# Patient Record
Sex: Female | Born: 1950 | Race: Black or African American | Hispanic: No | Marital: Single | Smoking: Never smoker
Health system: Southern US, Community
[De-identification: ages and names within clinical notes are randomized; demographics above are authoritative.]

## PROBLEM LIST (undated history)

## (undated) DIAGNOSIS — E119 Type 2 diabetes mellitus without complications: Secondary | ICD-10-CM

---

## 2018-02-16 ENCOUNTER — Encounter (HOSPITAL_BASED_OUTPATIENT_CLINIC_OR_DEPARTMENT_OTHER): Payer: Self-pay | Admitting: *Deleted

## 2018-02-16 ENCOUNTER — Emergency Department (HOSPITAL_BASED_OUTPATIENT_CLINIC_OR_DEPARTMENT_OTHER)
Admission: EM | Admit: 2018-02-16 | Discharge: 2018-02-16 | Disposition: A | Discharge status: Home / Self Care | Payer: Self-pay | Attending: Emergency Medicine | Admitting: Emergency Medicine

## 2018-02-16 ENCOUNTER — Other Ambulatory Visit: Payer: Self-pay

## 2018-02-16 DIAGNOSIS — Z7982 Long term (current) use of aspirin: Secondary | ICD-10-CM | POA: Insufficient documentation

## 2018-02-16 DIAGNOSIS — Y92410 Unspecified street and highway as the place of occurrence of the external cause: Secondary | ICD-10-CM | POA: Insufficient documentation

## 2018-02-16 DIAGNOSIS — M25561 Pain in right knee: Secondary | ICD-10-CM | POA: Insufficient documentation

## 2018-02-16 DIAGNOSIS — Y9389 Activity, other specified: Secondary | ICD-10-CM | POA: Insufficient documentation

## 2018-02-16 DIAGNOSIS — Y999 Unspecified external cause status: Secondary | ICD-10-CM | POA: Insufficient documentation

## 2018-02-16 MED ORDER — ACETAMINOPHEN 500 MG PO TABS
1000.0000 mg | ORAL_TABLET | Freq: Once | ORAL | Status: AC
  Administered 2018-02-16: 1000 mg via ORAL
  Filled 2018-02-16: qty 2
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
## 2018-03-29 ENCOUNTER — Encounter (HOSPITAL_BASED_OUTPATIENT_CLINIC_OR_DEPARTMENT_OTHER): Payer: Self-pay | Admitting: Emergency Medicine

## 2018-03-29 ENCOUNTER — Emergency Department (HOSPITAL_BASED_OUTPATIENT_CLINIC_OR_DEPARTMENT_OTHER)
Admission: EM | Admit: 2018-03-29 | Discharge: 2018-03-30 | Disposition: A | Discharge status: Home / Self Care | Payer: Self-pay | Attending: Emergency Medicine | Admitting: Emergency Medicine

## 2018-03-29 ENCOUNTER — Other Ambulatory Visit: Payer: Self-pay

## 2018-03-29 DIAGNOSIS — H538 Other visual disturbances: Secondary | ICD-10-CM | POA: Insufficient documentation

## 2018-03-29 DIAGNOSIS — R05 Cough: Secondary | ICD-10-CM | POA: Insufficient documentation

## 2018-03-29 DIAGNOSIS — R51 Headache: Secondary | ICD-10-CM | POA: Insufficient documentation

## 2018-03-29 DIAGNOSIS — H5712 Ocular pain, left eye: Secondary | ICD-10-CM | POA: Insufficient documentation

## 2018-03-29 DIAGNOSIS — J3489 Other specified disorders of nose and nasal sinuses: Secondary | ICD-10-CM | POA: Insufficient documentation

## 2018-03-29 DIAGNOSIS — R2689 Other abnormalities of gait and mobility: Secondary | ICD-10-CM | POA: Insufficient documentation

## 2018-03-29 MED ORDER — ERYTHROMYCIN 5 MG/GM OP OINT
TOPICAL_OINTMENT | Freq: Once | OPHTHALMIC | Status: AC
  Administered 2018-03-30: via OPHTHALMIC
  Filled 2018-03-29: qty 3.5

## 2018-03-29 MED ORDER — FLUORESCEIN SODIUM 1 MG OP STRP
1.0000 | ORAL_STRIP | Freq: Once | OPHTHALMIC | Status: AC
  Administered 2018-03-29: 1 via OPHTHALMIC
  Filled 2018-03-29: qty 1

## 2018-03-29 MED ORDER — TETRACAINE HCL 0.5 % OP SOLN
2.0000 [drp] | Freq: Once | OPHTHALMIC | Status: AC
  Administered 2018-03-29: 2 [drp] via OPHTHALMIC
  Filled 2018-03-29: qty 4

## 2018-03-29 NOTE — ED Triage Notes (Signed)
Patient states that she has had pain and burning to her left eye since Thursday - noted redness and swelling to her left eye

## 2018-03-29 NOTE — ED Provider Notes (Signed)
[facility] EMERGENCY DEPARTMENT Provider Note   CSN: [id number] Arrival date & time: 03/29/18  1901     History   Chief Complaint Chief Complaint  Patient presents with  . Eye Pain    HPI Rhonda Ballard is a 67 y.o. female.  HPI   Pt is a 67 y/o female with ah/o T2DM, HTN, who presents to the ED today c/o left eye pain that began about 2 days ago. Reports associated headache, photophobia, blurred vision, feeling off balance, rhinorrhea, cough, and painful extraocular movements. States it feels like there is something behind her eye. Denies FB sensation, numbness/weakness, slurred speech, aphasia, floaters, nausea, vomiting, rash, or trauma. Denies vision loss to the eye. 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  Has been taking tylenol and using eye drops with mild relief. Does not wear contacts. Wears glasses.   History reviewed. No pertinent past medical history.  There are no active problems to display for this patient.   History reviewed. No pertinent surgical history.   OB History   None    Home Medications    Prior to Admission medications   Not on File    Family History History reviewed. No pertinent family history.  Social History Social History   Tobacco Use  . Smoking status: Never Smoker  . Smokeless tobacco: Never Used  Substance Use Topics  . Alcohol use: Never    Frequency: Never  . Drug use: Never     Allergies   Patient has no known allergies.   Review of Systems Review of Systems  Constitutional: Negative for chills and fever.  HENT: Positive for rhinorrhea.   Eyes: Positive for photophobia, pain, discharge (tearing), redness and visual disturbance. Negative for itching.  Respiratory: Positive for cough. Negative for shortness of breath.   Cardiovascular: Negative for chest pain.  Gastrointestinal: Negative for nausea and vomiting.  Genitourinary: Negative for dysuria.  Musculoskeletal:  Negative for neck pain.  Skin: Negative for rash.  Neurological: Positive for headaches. Negative for weakness and numbness.    Physical Exam Updated Vital Signs BP (!) 152/84 (BP Location: Right Arm)   Pulse 94   Temp 98.2 F (36.8 C) (Oral)   Resp 18   Ht 5' (1.524 m)   Wt 65.8 kg   SpO2 99%   BMI 28.32 kg/m   Physical Exam  Constitutional: She appears well-developed and well-nourished. No distress.  HENT:  Head: Normocephalic and atraumatic.  Eyes: Pupils are equal, round, and reactive to light. EOM are normal.  Conjunctiva is injected on the left.  No significant pain with extraocular movements.  Fluorescein stain was completed and slit-lamp exam completed with uptake noted relatively diffusely without defined borders.  No dendritic appearance of uptake.  Tonometry R: 17, L: 18. Visual acuity; Bilat: 20/25, R: 20/20, L 20/50.  Positive photophobia.  Consensual photophobia.  Neck: Neck supple.  Cardiovascular: Normal rate and regular rhythm.  No murmur heard. Pulmonary/Chest: Effort normal and breath sounds normal. No respiratory distress.  Abdominal: Soft. There is no tenderness.  Musculoskeletal: She exhibits no edema.  Neurological: She is alert.  Mental Status:  Alert, thought content appropriate, able to give a coherent history. Speech fluent without evidence of aphasia. Able to follow 2 step commands without difficulty.  Cranial Nerves:  II:   pupils equal, round, reactive to light III,IV, VI: ptosis not present, extra-ocular motions intact bilaterally  V,VII: smile symmetric, facial light touch sensation equal  VIII: hearing grossly normal to voice  X: uvula elevates symmetrically  XI: bilateral shoulder shrug symmetric and strong XII: midline tongue extension without fassiculations Motor:  Normal tone. 5/5 strength of BUE and BLE major muscle groups including strong and equal grip strength and dorsiflexion/plantar flexion Sensory: light touch normal in all  extremities. DTRs: biceps and achilles 2+ symmetric b/l Cerebellar: normal finger-to-nose with bilateral upper extremities, normal heel to shin Gait: gait with mild ataxia to the right Positive romberg, negative pronator drift  Skin: Skin is warm and dry. Capillary refill takes less than 2 seconds.  Psychiatric: She has a normal mood and affect.  Nursing note and vitals reviewed.   ED Treatments / Results  Labs (all labs ordered are listed, but only abnormal results are displayed) Labs Reviewed - No data to display  EKG None  Radiology No results found.  Procedures Procedures (including critical care time)  Medications Ordered in ED Medications  fluorescein ophthalmic strip 1 strip (has no administration in time range)  tetracaine (PONTOCAINE) 0.5 % ophthalmic solution 2 drop (has no administration in time range)  erythromycin ophthalmic ointment (has no administration in time range)     Initial Impression / Assessment and Plan / ED Course  I have reviewed the triage vital signs and the nursing notes.  Pertinent labs & imaging results that were available during my care of the patient were reviewed by me and considered in my medical decision making (see chart for details).   11:55 PM CONSULT with Dr. Cathey EndowBowen with ophthalmology who recommended placing the patient on erythromycin ointment and having her follow-up with her ophthalmologist tomorrow.  He stated that if she is unable to follow-up with her ophthalmologist she can be seen in his office.  Discussed patient case with Dr. Criss AlvineGoldston who personally evaluated the patient and stated that he has low concern for CVA and does not feel that imaging of the brain is necessary at this time.  He recommended contacting ophthalmology for their recommendations.  Final Clinical Impressions(s) / ED Diagnoses   Final diagnoses:  Left eye pain   Patient is a 67 year old female presenting to the ED today complaining of left eye pain,  photophobia, and recent URI symptoms.  Has photophobia and consensual photophobia on exam.  Vision is mildly decreased in the left eye.  Pressures are normal bilaterally.  There is diffuse uptake of floor seen on slit-lamp exam.  No dendritic appearance of uptake.  Consulted with ophthalmology with recommendations as above.  Patient given erythromycin ointment in the ED and advised to call her neurologist in the morning to make an appointment for follow-up.  Advised her to return to the ER sooner if she has any new or worsening symptoms in the meantime.  Patient voiced understanding the plan reasons to return immediately to the ED.  All questions answered.  ED Discharge Orders    None       Rayne DuCouture, Kayron Kalmar S, PA-C 03/30/18 0005    Pricilla LovelessGoldston, Scott, MD 04/01/18 [phone]

## 2018-03-30 NOTE — ED Notes (Signed)
Pt discharged to home with family. NAD.  

## 2018-03-30 NOTE — Discharge Instructions (Addendum)
You were placed on an antibiotic ointment called erythromycin.  You should apply the ointment to your eye 4 times daily for the next 5 days.  Call your ophthalmology office tomorrow morning to make an appointment for follow-up.  If you are unable to make an appoint with your ophthalmologist tomorrow he may call the ophthalmologist that is listed on your discharge paperwork.  If you have any new or worsening symptoms in the meantime including any fevers, chills, vision changes, please return to the emergency department immediately.

## 2021-03-05 ENCOUNTER — Other Ambulatory Visit: Payer: Self-pay

## 2021-03-05 ENCOUNTER — Emergency Department (HOSPITAL_COMMUNITY)
Admission: EM | Admit: 2021-03-05 | Discharge: 2021-03-06 | Disposition: A | Discharge status: Home / Self Care | Payer: Medicare Other | Attending: Emergency Medicine | Admitting: Emergency Medicine

## 2021-03-05 ENCOUNTER — Encounter (HOSPITAL_COMMUNITY): Payer: Self-pay

## 2021-03-05 DIAGNOSIS — M545 Low back pain, unspecified: Secondary | ICD-10-CM | POA: Diagnosis not present

## 2021-03-05 DIAGNOSIS — R112 Nausea with vomiting, unspecified: Secondary | ICD-10-CM | POA: Diagnosis present

## 2021-03-05 DIAGNOSIS — Z7984 Long term (current) use of oral hypoglycemic drugs: Secondary | ICD-10-CM | POA: Diagnosis not present

## 2021-03-05 DIAGNOSIS — R000 Tachycardia, unspecified: Secondary | ICD-10-CM | POA: Diagnosis not present

## 2021-03-05 DIAGNOSIS — N3000 Acute cystitis without hematuria: Secondary | ICD-10-CM | POA: Diagnosis not present

## 2021-03-05 DIAGNOSIS — K5900 Constipation, unspecified: Secondary | ICD-10-CM | POA: Insufficient documentation

## 2021-03-05 DIAGNOSIS — I10 Essential (primary) hypertension: Secondary | ICD-10-CM | POA: Insufficient documentation

## 2021-03-05 DIAGNOSIS — Z794 Long term (current) use of insulin: Secondary | ICD-10-CM | POA: Diagnosis not present

## 2021-03-05 DIAGNOSIS — E1165 Type 2 diabetes mellitus with hyperglycemia: Secondary | ICD-10-CM | POA: Diagnosis not present

## 2021-03-05 DIAGNOSIS — R739 Hyperglycemia, unspecified: Secondary | ICD-10-CM

## 2021-03-05 MED ORDER — ONDANSETRON HCL 4 MG/2ML IJ SOLN
4.0000 mg | Freq: Once | INTRAMUSCULAR | Status: AC
  Administered 2021-03-06: 4 mg via INTRAVENOUS
  Filled 2021-03-05: qty 2

## 2021-03-05 MED ORDER — HYDROMORPHONE HCL 1 MG/ML IJ SOLN
0.5000 mg | Freq: Once | INTRAMUSCULAR | Status: AC
  Administered 2021-03-06: 0.5 mg via INTRAVENOUS
  Filled 2021-03-05: qty 1

## 2021-03-05 NOTE — ED Triage Notes (Signed)
Pt complains of left sided flank pain that wraps around to the front of her abdomen. This has been going on for 5 days. Pt states that she is now vomiting from pain.

## 2021-03-05 NOTE — ED Provider Notes (Signed)
[facility]-EMERGENCY DEPT Provider Note   CSN: [id number] Arrival date & time: 03/05/21  1957     History Chief Complaint  Patient presents with   Flank Pain   Abdominal Pain   Emesis    Rhonda Ballard is a 70 y.o. female with a history of diabetes mellitus who presents the emergency department with a chief complaint of vomiting.  The patient reports that she developed left sided lower back pain that has been intermittent for the last week.  She is unable to characterize the pain.  No known aggravating or alleviating factors.  Today, the pain began to radiate into her left lower abdomen accompanied by 7-8 episodes of nonbloody, nonbilious vomiting.  She attempted to take Tylenol for her symptoms, but was unable to keep any food or fluids.  She also adds that she has been constipated and has not had a bowel movement in 7 to 8 days.  She has a history of constipation, but states that she has never gone this long without having a bowel movement.  She has been passing flatus.  She denies fever, chills, diarrhea, dysuria, hematuria, chest pain, shortness of breath, vaginal bleeding, pain, or discharge, or URI symptoms.  She reports that she has been checking her blood sugars at home and they have been running between 300-350.  She has not been taking any medications for diabetes.  She finally called and got established with a new primary care provider and was seen on July 12.  She reports that she was not prescribed any medications from her visit.  She was initially having blurred vision, polydipsia, polyuria, but reports that over the last week that her urine has been more decreased.  Surgical history includes tubal ligation.  The history is provided by medical records and the patient. No language interpreter was used.      History reviewed. No pertinent past medical history.  There are no problems to display for this patient.   History reviewed. No pertinent  surgical history.   OB History   No obstetric history on file.     History reviewed. No pertinent family history.  Social History   Tobacco Use   Smoking status: Never   Smokeless tobacco: Never  Substance Use Topics   Alcohol use: Never   Drug use: Never    Home Medications Prior to Admission medications   Medication Sig Start Date End Date Taking? Authorizing Provider  Accu-Chek Softclix Lancets lancets Use as instructed 03/06/21  Yes Darrelyn Morro A, PA-C  cephALEXin (KEFLEX) 500 MG capsule Take 1 capsule (500 mg total) by mouth 4 (four) times daily for 5 days. 03/06/21 03/11/21 Yes Aleni Andrus A, PA-C  Dapagliflozin-metFORMIN HCl ER (XIGDUO XR) 12-998 MG TB24 Take 1 tablet by mouth daily. 03/06/21  Yes Liam Bossman A, PA-C  glucose blood (ACCU-CHEK GUIDE) test strip Use as instructed 03/06/21  Yes Danashia Landers A, PA-C  insulin glargine (LANTUS SOLOSTAR) 100 UNIT/ML Solostar Pen Inject 10 Units into the skin daily. 03/06/21  Yes Crewe Heathman A, PA-C  ondansetron (ZOFRAN ODT) 4 MG disintegrating tablet Take 1 tablet (4 mg total) by mouth every 8 (eight) hours as needed. 03/06/21  Yes Marijean Montanye A, PA-C  rosuvastatin (CRESTOR) 5 MG tablet Take 1 tablet (5 mg total) by mouth daily. 03/06/21  Yes Braedin Millhouse A, PA-C    Allergies    Patient has no known allergies.  Review of Systems   Review of Systems  Constitutional:  Negative  for activity change, chills and fever.  HENT:  Negative for congestion, sinus pressure, sinus pain and sore throat.   Eyes:  Negative for visual disturbance.  Respiratory:  Negative for cough, shortness of breath and wheezing.   Cardiovascular:  Negative for chest pain, palpitations and leg swelling.  Gastrointestinal:  Positive for abdominal pain, constipation, nausea and vomiting. Negative for blood in stool, diarrhea and rectal pain.  Genitourinary:  Negative for dysuria, flank pain, frequency, hematuria, pelvic pain, urgency, vaginal bleeding,  vaginal discharge and vaginal pain.  Musculoskeletal:  Positive for back pain. Negative for arthralgias, gait problem, joint swelling, myalgias, neck pain and neck stiffness.  Skin:  Negative for rash and wound.  Allergic/Immunologic: Negative for immunocompromised state.  Neurological:  Negative for seizures, syncope, weakness, light-headedness, numbness and headaches.  Psychiatric/Behavioral:  Negative for confusion.    Physical Exam Updated Vital Signs BP (!) 136/94   Pulse 93   Temp 98.6 F (37 C)   Resp 15   Ht 5' (1.524 m)   Wt 59.4 kg   SpO2 100%   BMI 25.58 kg/m   Physical Exam Vitals and nursing note reviewed.  Constitutional:      General: She is not in acute distress.    Appearance: She is not ill-appearing, toxic-appearing or diaphoretic.  HENT:     Head: Normocephalic.  Eyes:     Conjunctiva/sclera: Conjunctivae normal.  Cardiovascular:     Rate and Rhythm: Normal rate and regular rhythm.     Heart sounds: No murmur heard.   No friction rub. No gallop.  Pulmonary:     Effort: Pulmonary effort is normal. No respiratory distress.     Breath sounds: Normal breath sounds. No stridor. No wheezing, rhonchi or rales.  Chest:     Chest wall: No tenderness.  Abdominal:     General: There is no distension.     Palpations: Abdomen is soft. There is no mass.     Tenderness: There is abdominal tenderness. There is no right CVA tenderness, guarding or rebound.     Hernia: No hernia is present.     Comments: Tender to palpation in the left upper and lower abdomen as well as in the right lower quadrant.  There is no rebound and guarding.  Negative Rovsing sign.  No tenderness over McBurney's point.  Negative Murphy sign.  There is minimal left CVA tenderness.  No right CVA tenderness.  Hyperactive bowel sounds in all 4 quadrants.  Musculoskeletal:        General: No tenderness.     Cervical back: Neck supple.     Right lower leg: No edema.     Left lower leg: No edema.   Skin:    General: Skin is warm.     Findings: No rash.  Neurological:     Mental Status: She is alert.  Psychiatric:        Behavior: Behavior normal.    ED Results / Procedures / Treatments   Labs (all labs ordered are listed, but only abnormal results are displayed) Labs Reviewed  CBC WITH DIFFERENTIAL/PLATELET - Abnormal; Notable for the following components:      Result Value   Abs Immature Granulocytes 0.08 (*)    All other components within normal limits  COMPREHENSIVE METABOLIC PANEL - Abnormal; Notable for the following components:   Glucose, Bld 328 (*)    AST 14 (*)    All other components within normal limits  URINALYSIS, ROUTINE W REFLEX MICROSCOPIC -  Abnormal; Notable for the following components:   APPearance HAZY (*)    Glucose, UA >=500 (*)    Ketones, ur 80 (*)    Leukocytes,Ua LARGE (*)    WBC, UA >50 (*)    Bacteria, UA MANY (*)    All other components within normal limits  CBG MONITORING, ED - Abnormal; Notable for the following components:   Glucose-Capillary 325 (*)    All other components within normal limits  CBG MONITORING, ED - Abnormal; Notable for the following components:   Glucose-Capillary 243 (*)    All other components within normal limits  URINE CULTURE  LIPASE, BLOOD    EKG None  Radiology CT ABDOMEN PELVIS W CONTRAST  Result Date: 03/06/2021 CLINICAL DATA:  Left lower quadrant abdominal pain x5 days, nausea/vomiting EXAM: CT ABDOMEN AND PELVIS WITH CONTRAST TECHNIQUE: Multidetector CT imaging of the abdomen and pelvis was performed using the standard protocol following bolus administration of intravenous contrast. CONTRAST:  58mL OMNIPAQUE IOHEXOL 350 MG/ML SOLN COMPARISON:  None. FINDINGS: Lower chest: Mild linear scarring/atelectasis in the bilateral lower lungs, left lower lobe predominant, likely post infectious/inflammatory. Hepatobiliary: Liver is within normal limits. Gallbladder is unremarkable. No intrahepatic or extrahepatic  duct dilatation. Pancreas: Within normal limits. Spleen: Within normal limits. Adrenals/Urinary Tract: Adrenal glands are within normal limits. Kidneys are within normal limits.  No hydronephrosis. Bladder is mildly thick-walled. Stomach/Bowel: Stomach is within normal limits. No evidence of bowel obstruction. Normal appendix (series 2/image 60). No colonic wall thickening or inflammatory changes. Vascular/Lymphatic: No evidence of abdominal aortic aneurysm. No suspicious abdominopelvic lymphadenopathy. Reproductive: Uterus is within normal limits. Bilateral ovaries are within normal limits. Other: No abdominopelvic ascites. Musculoskeletal: Visualized osseous structures are within normal limits. IMPRESSION: Mildly thick-walled bladder, correlate for cystitis. Otherwise negative CT abdomen/pelvis. Electronically Signed   By: Charline Bills M.D.   On: 03/06/2021 02:45    Procedures Procedures   Medications Ordered in ED Medications  ondansetron (ZOFRAN) injection 4 mg (4 mg Intravenous Given 03/06/21 0019)  HYDROmorphone (DILAUDID) injection 0.5 mg (0.5 mg Intravenous Given 03/06/21 0019)  sodium chloride 0.9 % bolus 1,000 mL (0 mLs Intravenous Stopped 03/06/21 0314)  iohexol (OMNIPAQUE) 350 MG/ML injection 100 mL (80 mLs Intravenous Contrast Given 03/06/21 0210)  sodium chloride (PF) 0.9 % injection (  Given by Other 03/06/21 0154)  cefTRIAXone (ROCEPHIN) 1 g in sodium chloride 0.9 % 100 mL IVPB (0 g Intravenous Stopped 03/06/21 0349)    ED Course  I have reviewed the triage vital signs and the nursing notes.  Pertinent labs & imaging results that were available during my care of the patient were reviewed by me and considered in my medical decision making (see chart for details).    MDM Rules/Calculators/A&P                          70 year old female with a history of diabetes mellitus who presents to the emergency department with a chief complaint of a 1 week history of left-sided low back  pain that began radiating into the left lower abdomen today accompanied by nausea and vomiting.  She has also been more constipated over the last week.  No other associated symptoms.  She has a history of diabetes mellitus, but has not been on any medications for some time.  She has been symptomatic with polyuria, polydipsia, and blurred vision, but has noted urinary frequency has decreased over the last week as her symptoms have worsened.  Tachycardic.  Mild hypertension.  Afebrile.  Vital signs are otherwise unremarkable.  Labs and imaging been reviewed and independently interpreted by me.  No leukocytosis.  This is 328.  Normal bicarb and anion gap.  UA with significant ketonuria and glucosuria, likely secondary to untreated diabetes mellitus.  She does also have large leukocytes.  Nitrite negative.  I did review the patient's chart and it appears that her PCP has called and Lantus and metformin, but the patient has not yet started the medications.  It sounds as if there has been some confusion about what pharmacy the medications were called into.  She was given Dilaudid for pain control and Zofran for nausea, with good control of her symptoms.  IV fluid bolus given.  Differential diagnosis includes pyelonephritis, diverticulitis, bowel obstruction, mesenteric ischemia, obstructive uropathy, appendicitis.  Will order CT abdomen pelvis for further evaluation.  CT abdomen pelvis consistent with cystitis.  She was given Rocephin in the ED.  She was successfully fluid challenge.  Urine culture has been sent.  Will discharge home with symptomatic management for nausea and vomiting, Keflex for UTI.  I have reviewed her PCPs note and sent a prescription of her home medications to the pharmacy after confirming this with her.  She is hemodynamically stable and in no acute distress.  Safe for discharge to home with outpatient follow-up.  ER return precautions given.  Final Clinical Impression(s) / ED  Diagnoses Final diagnoses:  Acute cystitis without hematuria  Hyperglycemia    Rx / DC Orders ED Discharge Orders          Ordered    cephALEXin (KEFLEX) 500 MG capsule  4 times daily        03/06/21 0413    ondansetron (ZOFRAN ODT) 4 MG disintegrating tablet  Every 8 hours PRN        03/06/21 0413    rosuvastatin (CRESTOR) 5 MG tablet  Daily        03/06/21 0413    Accu-Chek Softclix Lancets lancets        03/06/21 0413    glucose blood (ACCU-CHEK GUIDE) test strip        03/06/21 0413    Dapagliflozin-metFORMIN HCl ER (XIGDUO XR) 12-998 MG TB24  Daily        03/06/21 0413    insulin glargine (LANTUS SOLOSTAR) 100 UNIT/ML Solostar Pen  Daily        03/06/21 0413             Barkley BoardsMcDonald, Tesslyn Baumert A, PA-C 03/06/21 0836    Sabas SousBero, Michael M, MD 03/07/21 [phone]

## 2021-03-06 ENCOUNTER — Encounter (HOSPITAL_COMMUNITY): Payer: Self-pay

## 2021-03-06 ENCOUNTER — Telehealth (HOSPITAL_COMMUNITY): Payer: Self-pay | Admitting: Emergency Medicine

## 2021-03-06 ENCOUNTER — Emergency Department (HOSPITAL_COMMUNITY): Payer: Medicare Other

## 2021-03-06 LAB — CBC WITH DIFFERENTIAL/PLATELET
Abs Immature Granulocytes: 0.08 10*3/uL — ABNORMAL HIGH (ref 0.00–0.07)
Basophils Absolute: 0 10*3/uL (ref 0.0–0.1)
Basophils Relative: 1 %
Eosinophils Absolute: 0 10*3/uL (ref 0.0–0.5)
Eosinophils Relative: 0 %
HCT: 42.1 % (ref 36.0–46.0)
Hemoglobin: 13.8 g/dL (ref 12.0–15.0)
Immature Granulocytes: 1 %
Lymphocytes Relative: 23 %
Lymphs Abs: 1.7 10*3/uL (ref 0.7–4.0)
MCH: 27.7 pg (ref 26.0–34.0)
MCHC: 32.8 g/dL (ref 30.0–36.0)
MCV: 84.4 fL (ref 80.0–100.0)
Monocytes Absolute: 0.4 10*3/uL (ref 0.1–1.0)
Monocytes Relative: 6 %
Neutro Abs: 5.1 10*3/uL (ref 1.7–7.7)
Neutrophils Relative %: 69 %
Platelets: 208 10*3/uL (ref 150–400)
RBC: 4.99 MIL/uL (ref 3.87–5.11)
RDW: 13 % (ref 11.5–15.5)
WBC: 7.4 10*3/uL (ref 4.0–10.5)
nRBC: 0 % (ref 0.0–0.2)

## 2021-03-06 LAB — URINALYSIS, ROUTINE W REFLEX MICROSCOPIC
Bilirubin Urine: NEGATIVE
Glucose, UA: >=500 mg/dL — AB
Hgb urine dipstick: NEGATIVE
Ketones, ur: 80 mg/dL — AB
Nitrite: NEGATIVE
Protein, ur: NEGATIVE mg/dL
Specific Gravity, Urine: 1.024 (ref 1.005–1.030)
WBC, UA: >50 WBC/hpf — ABNORMAL HIGH (ref 0–5)
pH: 6 (ref 5.0–8.0)

## 2021-03-06 LAB — CBG MONITORING, ED
Glucose-Capillary: 243 mg/dL — ABNORMAL HIGH (ref 70–99)
Glucose-Capillary: 325 mg/dL — ABNORMAL HIGH (ref 70–99)

## 2021-03-06 LAB — COMPREHENSIVE METABOLIC PANEL
ALT: 15 U/L (ref 0–44)
AST: 14 U/L — ABNORMAL LOW (ref 15–41)
Albumin: 4.4 g/dL (ref 3.5–5.0)
Alkaline Phosphatase: 82 U/L (ref 38–126)
Anion gap: 10 (ref 5–15)
BUN: 21 mg/dL (ref 8–23)
CO2: 29 mmol/L (ref 22–32)
Calcium: 9.9 mg/dL (ref 8.9–10.3)
Chloride: 99 mmol/L (ref 98–111)
Creatinine, Ser: 0.76 mg/dL (ref 0.44–1.00)
GFR, Estimated: >60 mL/min (ref >60)
Glucose, Bld: 328 mg/dL — ABNORMAL HIGH (ref 70–99)
Potassium: 4.3 mmol/L (ref 3.5–5.1)
Sodium: 138 mmol/L (ref 135–145)
Total Bilirubin: 0.8 mg/dL (ref 0.3–1.2)
Total Protein: 7.7 g/dL (ref 6.5–8.1)

## 2021-03-06 LAB — LIPASE, BLOOD: Lipase: 28 U/L (ref 11–51)

## 2021-03-06 MED ORDER — XIGDUO XR 5-1000 MG PO TB24: 1.0000 | ORAL_TABLET | Freq: Every day | ORAL | 0 refills | Status: AC

## 2021-03-06 MED ORDER — CEPHALEXIN 500 MG PO CAPS: 500.0000 mg | ORAL_CAPSULE | Freq: Four times a day (QID) | ORAL | 0 refills | Status: DC

## 2021-03-06 MED ORDER — SODIUM CHLORIDE 0.9 % IV SOLN
1.0000 g | Freq: Once | INTRAVENOUS | Status: AC
  Administered 2021-03-06: 1 g via INTRAVENOUS
  Filled 2021-03-06: qty 10

## 2021-03-06 MED ORDER — SODIUM CHLORIDE 0.9 % IV BOLUS
1000.0000 mL | Freq: Once | INTRAVENOUS | Status: AC
  Administered 2021-03-06: 1000 mL via INTRAVENOUS

## 2021-03-06 MED ORDER — ROSUVASTATIN CALCIUM 5 MG PO TABS: 5.0000 mg | ORAL_TABLET | Freq: Every day | ORAL | 0 refills | Status: AC

## 2021-03-06 MED ORDER — ONDANSETRON 4 MG PO TBDP: 4.0000 mg | ORAL_TABLET | Freq: Three times a day (TID) | ORAL | 0 refills | Status: DC | PRN

## 2021-03-06 MED ORDER — IOHEXOL 350 MG/ML SOLN
100.0000 mL | Freq: Once | INTRAVENOUS | Status: AC | PRN
  Administered 2021-03-06: 80 mL via INTRAVENOUS

## 2021-03-06 MED ORDER — ACCU-CHEK SOFTCLIX LANCETS MISC: 12 refills | Status: AC

## 2021-03-06 MED ORDER — ONDANSETRON 4 MG PO TBDP: 4.0000 mg | ORAL_TABLET | Freq: Three times a day (TID) | ORAL | 0 refills | Status: AC | PRN

## 2021-03-06 MED ORDER — ACCU-CHEK GUIDE VI STRP: ORAL_STRIP | 12 refills | Status: DC

## 2021-03-06 MED ORDER — ACCU-CHEK GUIDE VI STRP: ORAL_STRIP | 12 refills | Status: AC

## 2021-03-06 MED ORDER — SODIUM CHLORIDE (PF) 0.9 % IJ SOLN
INTRAMUSCULAR | Status: AC
  Filled 2021-03-06: qty 50

## 2021-03-06 MED ORDER — ROSUVASTATIN CALCIUM 5 MG PO TABS: 5.0000 mg | ORAL_TABLET | Freq: Every day | ORAL | 0 refills | Status: DC

## 2021-03-06 MED ORDER — LANTUS SOLOSTAR 100 UNIT/ML ~~LOC~~ SOPN: 10.0000 [IU] | PEN_INJECTOR | Freq: Every day | SUBCUTANEOUS | 11 refills | Status: DC

## 2021-03-06 MED ORDER — CEPHALEXIN 500 MG PO CAPS: 500.0000 mg | ORAL_CAPSULE | Freq: Four times a day (QID) | ORAL | 0 refills | Status: AC

## 2021-03-06 MED ORDER — LANTUS SOLOSTAR 100 UNIT/ML ~~LOC~~ SOPN: 10.0000 [IU] | PEN_INJECTOR | Freq: Every day | SUBCUTANEOUS | 11 refills | Status: AC

## 2021-03-06 MED ORDER — ACCU-CHEK SOFTCLIX LANCETS MISC: 12 refills | Status: DC

## 2021-03-06 MED ORDER — XIGDUO XR 5-1000 MG PO TB24: 1.0000 | ORAL_TABLET | Freq: Every day | ORAL | 0 refills | Status: DC

## 2021-03-06 NOTE — ED Notes (Signed)
Pt provided water for fluid challenge 

## 2021-03-06 NOTE — ED Notes (Signed)
Pt ambulatory to bathroom

## 2021-03-06 NOTE — Telephone Encounter (Signed)
8:50 AM Called by [facility] in regards to 7 different medications and diabetic supplies prescribed by provider yesterday.  Unfortunately, [facility] is unable to fill these medications as there is no supervising physician listed.  I was asked to represcribe with the supervising physician.  Reviewed patient's chart.  Medication sent.

## 2021-03-06 NOTE — ED Notes (Signed)
Pt tolerated PO challenge

## 2021-03-06 NOTE — Discharge Instructions (Signed)
Thank you for allowing me to care for you today in the Emergency Department.   Your symptoms today seem to be caused by urinary tract infection.  Your blood sugar was also high today.  Take 1 tablet of Keflex every 6 hours for the next 5 days for the urinary tract infection.  Make sure to complete this entire course of antibiotics even if your symptoms improve.  Let 1 tablet of Zofran dissolve under your tongue every 8 hours as needed for nausea or vomiting.  Take 650 mg of Tylenol or 600 mg of ibuprofen with food every 6 hours for pain.  You can alternate between these 2 medications every 3 hours if your pain returns.  For instance, you can take Tylenol at noon, followed by a dose of ibuprofen at 3, followed by second dose of Tylenol and 6.  Your pain should also resolve as your infection is improving.  I have called and a prescription of all of the medications for diabetes and high cholesterol that your primary care provider tried to call into your pharmacy.  Please call him to schedule a follow-up appointment for a recheck of your symptoms this week.  I have only called in a 1 month supply so you will need to get the remaining refills from your primary care provider.   There are 2 diabetes medications: Lantus and Dabagliflozin-metformin. Inject 10 units of Lantus into the skin at night. Take 1 tablet of dabagliflozin-metformin by mouth daily.  Make sure that you are checking your blood sugar at home regularly.  Take 1 tablet of rosuvastatin by mouth daily for high cholesterol.  You can follow-up with your primary care provider if you have any questions about these medications that he has prescribed.  Return to the emergency department if you become confused, have uncontrollable vomiting despite taking Zofran, if your pain significantly worsens after you have been on antibiotics for more than 48 to 72 hours, or if you have other new, concerning symptoms.

## 2021-03-07 LAB — URINE CULTURE: Culture: >=100000 — AB

## 2021-03-08 ENCOUNTER — Telehealth: Payer: Self-pay

## 2021-03-08 NOTE — Telephone Encounter (Signed)
Post ED Visit - Positive Culture Follow-up  Culture report reviewed by antimicrobial stewardship pharmacist: [facility] []  , Pharm.D. []  Enzo Bi, Pharm.D., BCPS AQ-ID []  , Pharm.D., BCPS []  Celedonio Miyamoto, .D., BCPS []  [place], .D., BCPS, AAHIVP []  Georgina Pillion, Pharm.D., BCPS, AAHIVP []  [address], PharmD, BCPS []  , PharmD, BCPS []  [place], PharmD, BCPS []  [address], PharmD []  , PharmD, BCPS []  Estella Husk, PharmD  Pharmacy Team [x]  Lysle Pearl, PharmD []  , PharmD []  Phillips Climes, PharmD []  , Rph []  Agapito Games) , PharmD []  Verlan Friends, PharmD []  , PharmD []  Mervyn Gay, PharmD []  , PharmD []  Vinnie Level, PharmD []  [facility], PharmD []  , PharmD []  [place], PharmD   Positive urine culture Treated with Cephalexin, organism sensitive to the same and no further patient follow-up is required at this time.  03/08/2021, 11:00 AM

## 2022-08-02 IMAGING — CT CT ABD-PELV W/ CM
2 of 5 series · 16 of 46 positions shown, 18 images · IV contrast (OMNIPAQUE 350)
Comparison: None.

CLINICAL DATA: Left lower quadrant abdominal pain x5 days,
nausea/vomiting

EXAM:
CT ABDOMEN AND PELVIS WITH CONTRAST
TECHNIQUE: Multidetector CT imaging of the abdomen and pelvis was performed
using the standard protocol following bolus administration of
intravenous contrast.
CONTRAST:  80mL OMNIPAQUE IOHEXOL 350 MG/ML SOLN

[Series 2: axial st · axial · 0.61mm/px · z∈[-570,-195]mm · 13 of 87 slices shown, 15 images]
[im 6/87  soft-tissue]
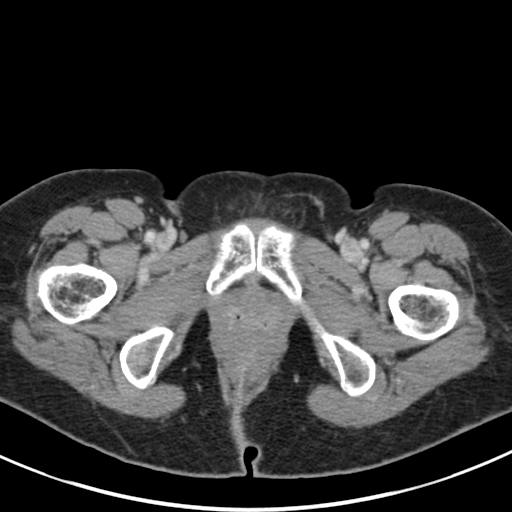
[im 6/87  bone]
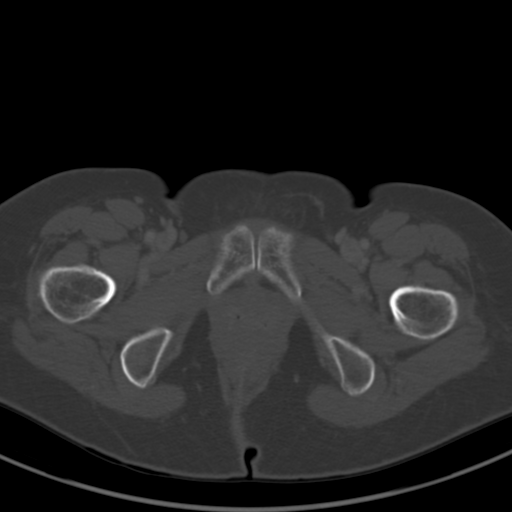
[im 12/87  soft-tissue]
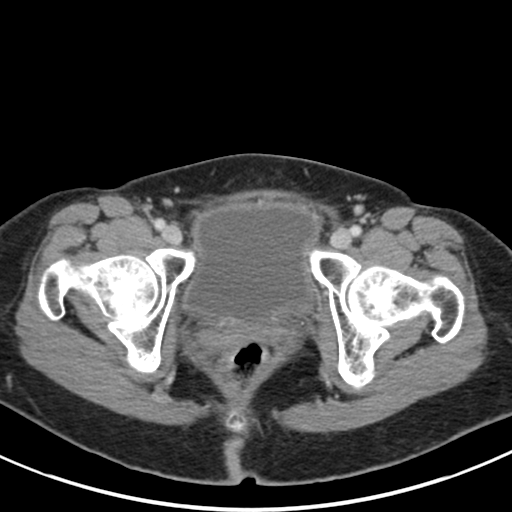
[im 18/87  soft-tissue]
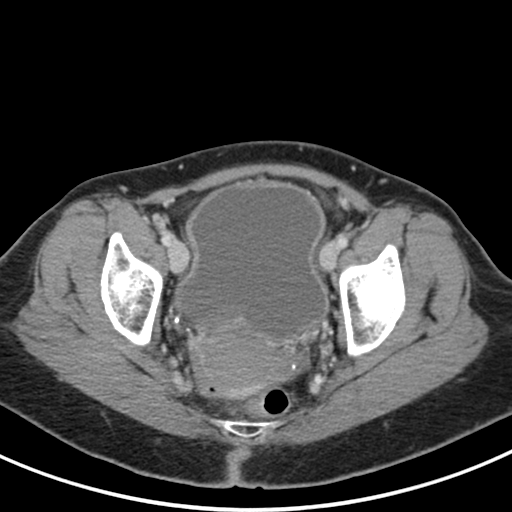
[im 23/87  soft-tissue]
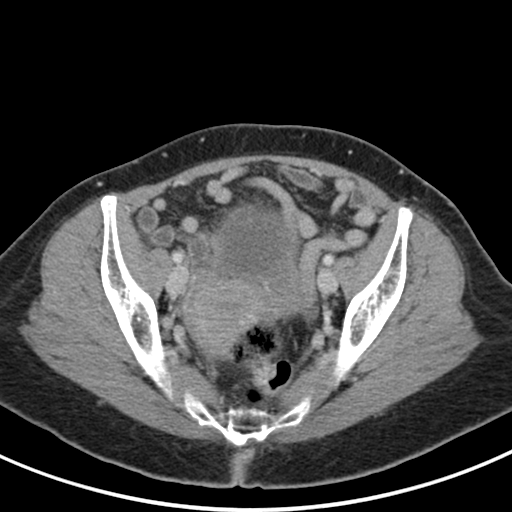
[im 29/87  soft-tissue]
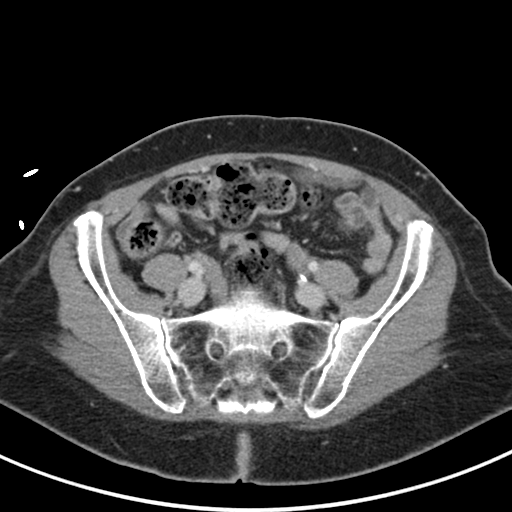
[im 35/87  soft-tissue]
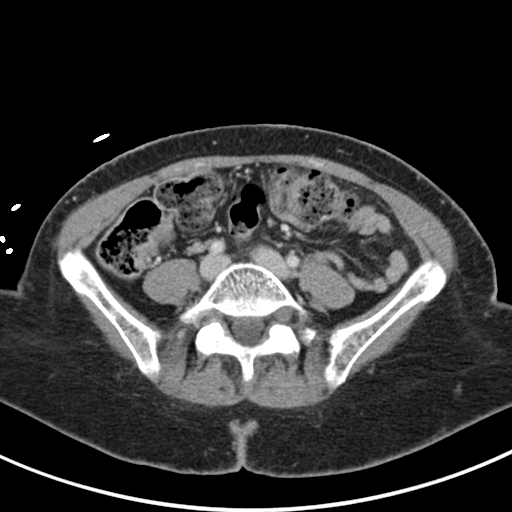
[im 46/87  soft-tissue]
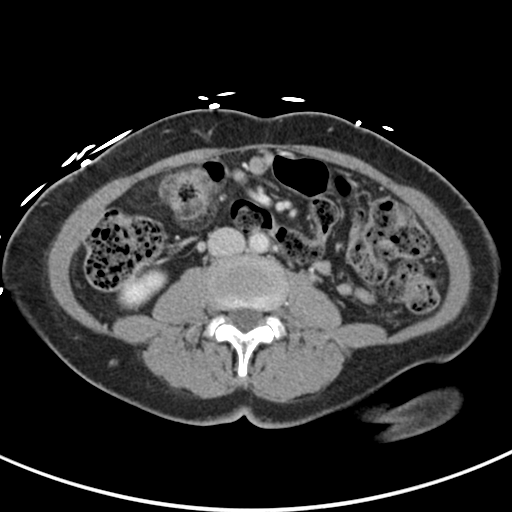
[im 52/87  soft-tissue]
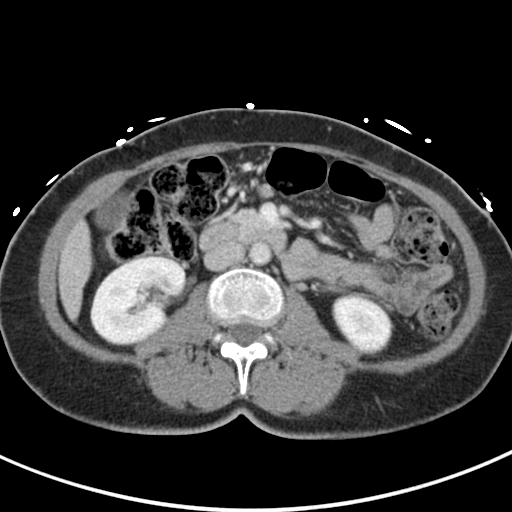
[im 58/87  soft-tissue]
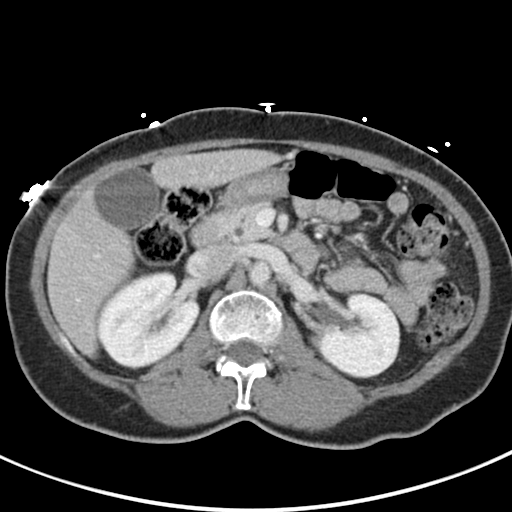
[im 58/87  bone]
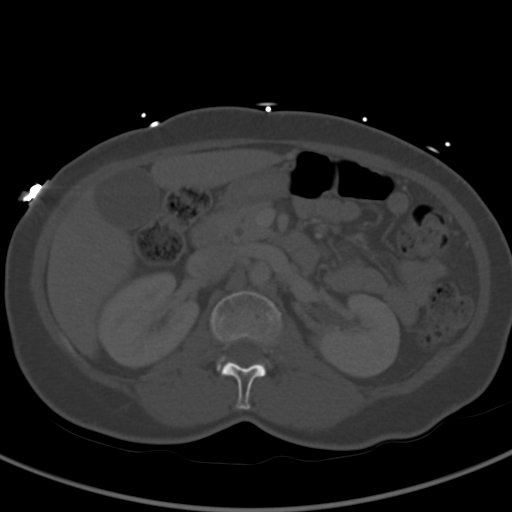
[im 64/87  soft-tissue]
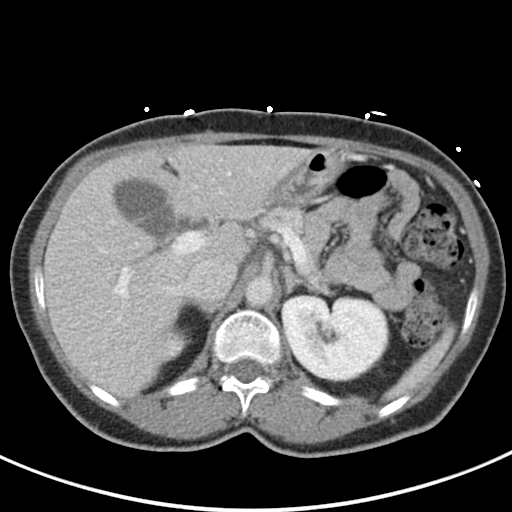
[im 69/87  soft-tissue]
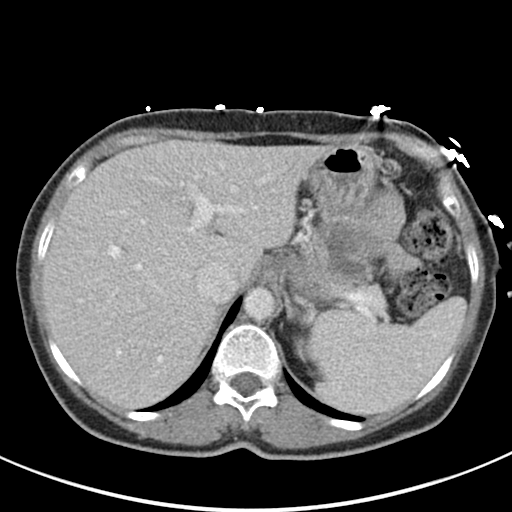
[im 75/87  soft-tissue]
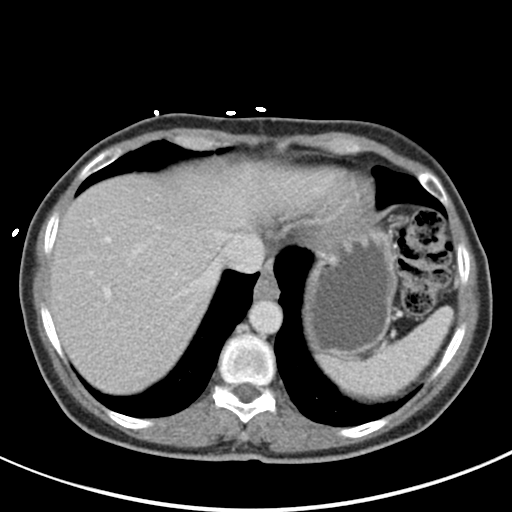
[im 81/87  soft-tissue]
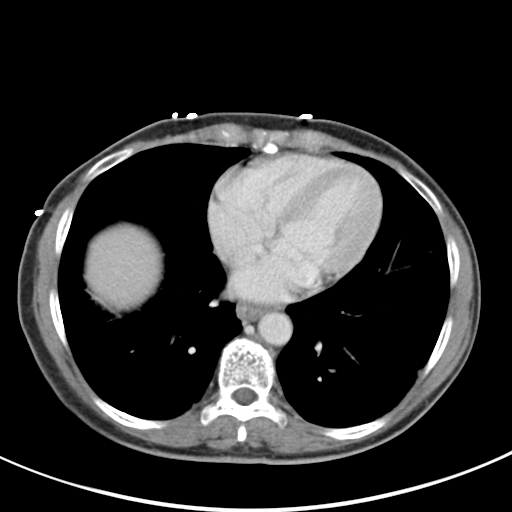

[Series 4: coronal st · coronal · 0.61mm/px · 3 of 112 slices shown]
[im 38/112  soft-tissue]
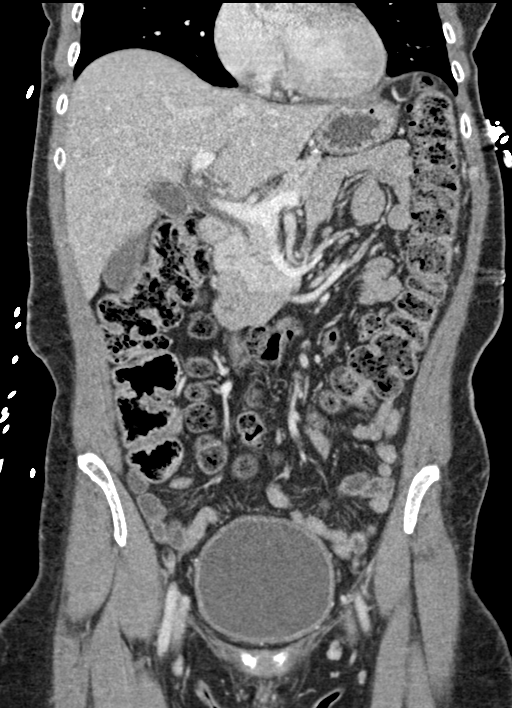
[im 50/112  soft-tissue]
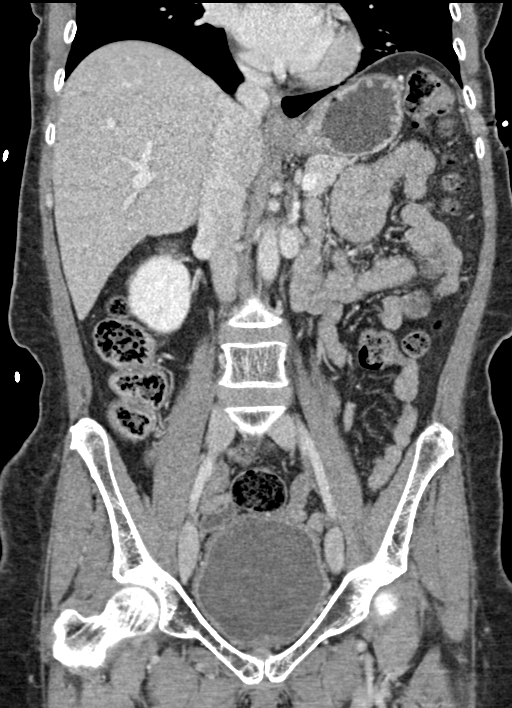
[im 62/112  soft-tissue]
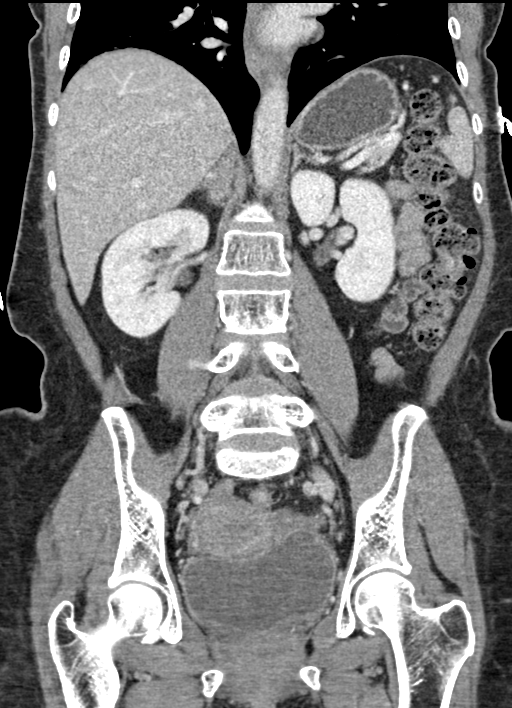

[16 of 46 positions shown; findings below may reference images not displayed]

FINDINGS: Lower chest: Mild linear scarring/atelectasis in the bilateral lower
lungs, left lower lobe predominant, likely post
infectious/inflammatory.

Hepatobiliary: Liver is within normal limits.

Gallbladder is unremarkable. No intrahepatic or extrahepatic duct
dilatation.

Pancreas: Within normal limits.

Spleen: Within normal limits.

Adrenals/Urinary Tract: Adrenal glands are within normal limits.

Kidneys are within normal limits.  No hydronephrosis.

Bladder is mildly thick-walled.

Stomach/Bowel: Stomach is within normal limits.

No evidence of bowel obstruction.

Normal appendix (series 2/image 60).

No colonic wall thickening or inflammatory changes.

Vascular/Lymphatic: No evidence of abdominal aortic aneurysm.

No suspicious abdominopelvic lymphadenopathy.

Reproductive: Uterus is within normal limits.

Bilateral ovaries are within normal limits.

Other: No abdominopelvic ascites.

Musculoskeletal: Visualized osseous structures are within normal
limits.
IMPRESSION: Mildly thick-walled bladder, correlate for cystitis.

Otherwise negative CT abdomen/pelvis.

## 2022-11-25 ENCOUNTER — Emergency Department (HOSPITAL_BASED_OUTPATIENT_CLINIC_OR_DEPARTMENT_OTHER): Payer: Medicare Other

## 2022-11-25 ENCOUNTER — Encounter (HOSPITAL_BASED_OUTPATIENT_CLINIC_OR_DEPARTMENT_OTHER): Payer: Self-pay | Admitting: Urology

## 2022-11-25 ENCOUNTER — Emergency Department (HOSPITAL_BASED_OUTPATIENT_CLINIC_OR_DEPARTMENT_OTHER)
Admission: EM | Admit: 2022-11-25 | Discharge: 2022-11-25 | Disposition: A | Discharge status: Home / Self Care | Payer: Medicare Other | Attending: Emergency Medicine | Admitting: Emergency Medicine

## 2022-11-25 DIAGNOSIS — R1013 Epigastric pain: Secondary | ICD-10-CM | POA: Diagnosis present

## 2022-11-25 DIAGNOSIS — E860 Dehydration: Secondary | ICD-10-CM | POA: Insufficient documentation

## 2022-11-25 DIAGNOSIS — K2970 Gastritis, unspecified, without bleeding: Secondary | ICD-10-CM | POA: Insufficient documentation

## 2022-11-25 DIAGNOSIS — T50905A Adverse effect of unspecified drugs, medicaments and biological substances, initial encounter: Secondary | ICD-10-CM | POA: Insufficient documentation

## 2022-11-25 DIAGNOSIS — Z794 Long term (current) use of insulin: Secondary | ICD-10-CM | POA: Insufficient documentation

## 2022-11-25 DIAGNOSIS — T887XXA Unspecified adverse effect of drug or medicament, initial encounter: Secondary | ICD-10-CM

## 2022-11-25 HISTORY — DX: Type 2 diabetes mellitus without complications: E11.9

## 2022-11-25 LAB — CBC WITH DIFFERENTIAL/PLATELET
Abs Immature Granulocytes: 0.02 10*3/uL (ref 0.00–0.07)
Basophils Absolute: 0 10*3/uL (ref 0.0–0.1)
Basophils Relative: 0 %
Eosinophils Absolute: 0 10*3/uL (ref 0.0–0.5)
Eosinophils Relative: 0 %
HCT: 46.3 % — ABNORMAL HIGH (ref 36.0–46.0)
Hemoglobin: 15.5 g/dL — ABNORMAL HIGH (ref 12.0–15.0)
Immature Granulocytes: 0 %
Lymphocytes Relative: 19 %
Lymphs Abs: 1.6 10*3/uL (ref 0.7–4.0)
MCH: 27.6 pg (ref 26.0–34.0)
MCHC: 33.5 g/dL (ref 30.0–36.0)
MCV: 82.5 fL (ref 80.0–100.0)
Monocytes Absolute: 0.5 10*3/uL (ref 0.1–1.0)
Monocytes Relative: 6 %
Neutro Abs: 6.2 10*3/uL (ref 1.7–7.7)
Neutrophils Relative %: 75 %
Platelets: 214 10*3/uL (ref 150–400)
RBC: 5.61 MIL/uL — ABNORMAL HIGH (ref 3.87–5.11)
RDW: 13 % (ref 11.5–15.5)
WBC: 8.4 10*3/uL (ref 4.0–10.5)
nRBC: 0 % (ref 0.0–0.2)

## 2022-11-25 LAB — COMPREHENSIVE METABOLIC PANEL
ALT: 17 U/L (ref 0–44)
AST: 16 U/L (ref 15–41)
Albumin: 4.2 g/dL (ref 3.5–5.0)
Alkaline Phosphatase: 66 U/L (ref 38–126)
Anion gap: 14 (ref 5–15)
BUN: 33 mg/dL — ABNORMAL HIGH (ref 8–23)
CO2: 24 mmol/L (ref 22–32)
Calcium: 9.3 mg/dL (ref 8.9–10.3)
Chloride: 95 mmol/L — ABNORMAL LOW (ref 98–111)
Creatinine, Ser: 1 mg/dL (ref 0.44–1.00)
GFR, Estimated: >60 mL/min (ref >60)
Glucose, Bld: 266 mg/dL — ABNORMAL HIGH (ref 70–99)
Potassium: 3.5 mmol/L (ref 3.5–5.1)
Sodium: 133 mmol/L — ABNORMAL LOW (ref 135–145)
Total Bilirubin: 1.2 mg/dL (ref 0.3–1.2)
Total Protein: 8.1 g/dL (ref 6.5–8.1)

## 2022-11-25 LAB — URINALYSIS, ROUTINE W REFLEX MICROSCOPIC
Bilirubin Urine: NEGATIVE
Glucose, UA: 250 mg/dL — AB
Ketones, ur: 40 mg/dL — AB
Nitrite: NEGATIVE
Protein, ur: 30 mg/dL — AB
Specific Gravity, Urine: 1.015 (ref 1.005–1.030)
pH: 6 (ref 5.0–8.0)

## 2022-11-25 LAB — URINALYSIS, MICROSCOPIC (REFLEX)

## 2022-11-25 LAB — LIPASE, BLOOD: Lipase: 32 U/L (ref 11–51)

## 2022-11-25 MED ORDER — ESOMEPRAZOLE MAGNESIUM 40 MG PO CPDR: 40.0000 mg | DELAYED_RELEASE_CAPSULE | Freq: Every day | ORAL | 0 refills | Status: AC

## 2022-11-25 MED ORDER — METOCLOPRAMIDE HCL 10 MG PO TABS: 10.0000 mg | ORAL_TABLET | Freq: Four times a day (QID) | ORAL | 0 refills | Status: AC | PRN

## 2022-11-25 MED ORDER — IOHEXOL 300 MG/ML  SOLN
100.0000 mL | Freq: Once | INTRAMUSCULAR | Status: AC | PRN
  Administered 2022-11-25: 100 mL via INTRAVENOUS

## 2022-11-25 MED ORDER — METOCLOPRAMIDE HCL 5 MG/ML IJ SOLN
10.0000 mg | Freq: Once | INTRAMUSCULAR | Status: AC
  Administered 2022-11-25: 10 mg via INTRAVENOUS
  Filled 2022-11-25: qty 2

## 2022-11-25 MED ORDER — DIPHENHYDRAMINE HCL 50 MG/ML IJ SOLN
25.0000 mg | Freq: Once | INTRAMUSCULAR | Status: AC
  Administered 2022-11-25: 25 mg via INTRAVENOUS
  Filled 2022-11-25: qty 1

## 2022-11-25 MED ORDER — SODIUM CHLORIDE 0.9 % IV BOLUS
1000.0000 mL | Freq: Once | INTRAVENOUS | Status: AC
  Administered 2022-11-25: 1000 mL via INTRAVENOUS

## 2022-11-25 NOTE — ED Provider Notes (Signed)
[facility] Provider Note   CSN: [id number] Arrival date & time: 11/25/22  2009     History  Chief Complaint  Patient presents with   Emesis    Keerthi Westfall is a 72 y.o. female here presenting with vomiting.  Patient states that she was started on Ozempic about a month ago for weight loss.  She states that about a week ago she started vomiting.  She has epigastric pain as well and was initially unable to tolerate anything.  She called her doctor and her doctor told her to stop Ozempic and she had a follow-up with her doctor today.  She had unremarkable labs in the clinic and was prescribed Zofran.  She took Zofran but still was vomiting and unable to keep down her diabetic meds.  Denies any sick contacts.  The history is provided by the patient.       Home Medications Prior to Admission medications   Medication Sig Start Date End Date Taking? Authorizing Provider  Accu-Chek Softclix Lancets lancets Use as instructed 03/06/21   Renne Crigler, PA-C  Dapagliflozin-metFORMIN HCl ER (XIGDUO XR) 12-998 MG TB24 Take 1 tablet by mouth daily. 03/06/21   Renne Crigler, PA-C  glucose blood (ACCU-CHEK GUIDE) test strip Use as instructed 03/06/21   Renne Crigler, PA-C  insulin glargine (LANTUS SOLOSTAR) 100 UNIT/ML Solostar Pen Inject 10 Units into the skin daily. 03/06/21   Renne Crigler, PA-C  ondansetron (ZOFRAN ODT) 4 MG disintegrating tablet Take 1 tablet (4 mg total) by mouth every 8 (eight) hours as needed. 03/06/21   Renne Crigler, PA-C  rosuvastatin (CRESTOR) 5 MG tablet Take 1 tablet (5 mg total) by mouth daily. 03/06/21   Renne Crigler, PA-C      Allergies    Patient has no known allergies.    Review of Systems   Review of Systems  Gastrointestinal:  Positive for vomiting.  All other systems reviewed and are negative.   Physical Exam Updated Vital Signs BP (!) 143/77 (BP Location: Left Arm)   Pulse (!) 117   Temp 97.8 F (36.6  C)   Resp 18   Ht 5' (1.524 m)   Wt 59.9 kg   SpO2 100%   BMI 25.78 kg/m  Physical Exam Vitals and nursing note reviewed.  Constitutional:      Comments: Slightly dehydrated  HENT:     Head: Normocephalic.     Nose: Nose normal.     Mouth/Throat:     Mouth: Mucous membranes are dry.  Eyes:     Extraocular Movements: Extraocular movements intact.     Pupils: Pupils are equal, round, and reactive to light.  Cardiovascular:     Rate and Rhythm: Normal rate and regular rhythm.     Pulses: Normal pulses.     Heart sounds: Normal heart sounds.  Pulmonary:     Effort: Pulmonary effort is normal.     Breath sounds: Normal breath sounds.  Abdominal:     General: Abdomen is flat.     Palpations: Abdomen is soft.     Comments: Mild LUQ tenderness   Musculoskeletal:        General: Normal range of motion.     Cervical back: Normal range of motion and neck supple.  Skin:    General: Skin is warm.     Capillary Refill: Capillary refill takes less than 2 seconds.  Neurological:     General: No focal deficit present.  Mental Status: She is alert and oriented to person, place, and time.  Psychiatric:        Mood and Affect: Mood normal.        Behavior: Behavior normal.     ED Results / Procedures / Treatments   Labs (all labs ordered are listed, but only abnormal results are displayed) Labs Reviewed  CBC WITH DIFFERENTIAL/PLATELET - Abnormal; Notable for the following components:      Result Value   RBC 5.61 (*)    Hemoglobin 15.5 (*)    HCT 46.3 (*)    All other components within normal limits  COMPREHENSIVE METABOLIC PANEL - Abnormal; Notable for the following components:   Sodium 133 (*)    Chloride 95 (*)    Glucose, Bld 266 (*)    BUN 33 (*)    All other components within normal limits  LIPASE, BLOOD  URINALYSIS, ROUTINE W REFLEX MICROSCOPIC    EKG None  Radiology No results found.  Procedures Procedures    Medications Ordered in ED Medications   sodium chloride 0.9 % bolus 1,000 mL (1,000 mLs Intravenous New Bag/Given 11/25/22 2034)  metoCLOPramide (REGLAN) injection 10 mg (10 mg Intravenous Given 11/25/22 2035)  diphenhydrAMINE (BENADRYL) injection 25 mg (25 mg Intravenous Given 11/25/22 2034)    ED Course/ Medical Decision Making/ A&P                             Medical Decision Making Nikitta Wiedner is a 72 y.o. female here with LUQ pain and vomiting.  Consider gastroenteritis or colitis or diverticulitis or side effect from Ozempic.  Plan to get CBC and CMP and lipase and CT abdomen pelvis.  Will hydrate and reassess.  11:03 PM Reviewed patient's labs independently interpreted CT scan.  Patient's UA showed some ketones but no obvious UTI.  Labs unremarkable.  CT abdomen pelvis showed possible esophagitis versus gastritis.  I think this may be side effect of Ozempic versus gastroenteritis.  Plan to stop Ozempic.  Will prescribe PPI as well as nausea medicine.  She had colonoscopy with Dr. Octaviano Glow from [facility] and I told her to follow-up with GI.  Problems Addressed: Gastritis without bleeding, unspecified chronicity, unspecified gastritis type: acute illness or injury Medication side effect: acute illness or injury  Amount and/or Complexity of Data Reviewed Labs: ordered. Decision-making details documented in ED Course. Radiology: ordered and independent interpretation performed. Decision-making details documented in ED Course. ECG/medicine tests: ordered and independent interpretation performed. Decision-making details documented in ED Course.  Risk Prescription drug management.    Final Clinical Impression(s) / ED Diagnoses Final diagnoses:  None    Rx / DC Orders ED Discharge Orders     None         Charlynne Pander, MD 11/25/22 2305

## 2022-11-25 NOTE — ED Notes (Signed)
Discharge paperwork reviewed entirely with patient, including Rx's and follow up care. Pain was under control. Pt verbalized understanding as well as all parties involved. No questions or concerns voiced at the time of discharge. No acute distress noted.   Pt ambulated out to [facility] without incident or assistance.  

## 2022-11-25 NOTE — ED Triage Notes (Signed)
Pt states started vomiting 6 days ago and reports diarrhea on Wednesday but that has resolved States not tolerated po intake  Taking zofran ODT with no relief   H/o DM, not taking meds due to vomiting

## 2022-11-25 NOTE — Discharge Instructions (Addendum)
I think you likely have medication side effect from Ozempic.  Please stop taking Ozempic  Your CT scan today showed some gastritis.  I started you on Nexium daily  Take Reglan as needed for nausea or vomiting  Please stay hydrated  See your doctor for follow-up.  Consider following up with your GI doctor  Return to ER if you have worse abdominal pain or vomiting or dehydration

## 2022-11-28 ENCOUNTER — Emergency Department (HOSPITAL_BASED_OUTPATIENT_CLINIC_OR_DEPARTMENT_OTHER)
Admission: EM | Admit: 2022-11-28 | Discharge: 2022-11-28 | Disposition: A | Discharge status: Home / Self Care | Payer: Medicare Other | Attending: Emergency Medicine | Admitting: Emergency Medicine

## 2022-11-28 ENCOUNTER — Other Ambulatory Visit: Payer: Self-pay

## 2022-11-28 ENCOUNTER — Encounter (HOSPITAL_BASED_OUTPATIENT_CLINIC_OR_DEPARTMENT_OTHER): Payer: Self-pay | Admitting: Pediatrics

## 2022-11-28 ENCOUNTER — Emergency Department (HOSPITAL_BASED_OUTPATIENT_CLINIC_OR_DEPARTMENT_OTHER): Payer: Medicare Other

## 2022-11-28 DIAGNOSIS — E119 Type 2 diabetes mellitus without complications: Secondary | ICD-10-CM | POA: Diagnosis not present

## 2022-11-28 DIAGNOSIS — Z794 Long term (current) use of insulin: Secondary | ICD-10-CM | POA: Insufficient documentation

## 2022-11-28 DIAGNOSIS — R1013 Epigastric pain: Secondary | ICD-10-CM | POA: Insufficient documentation

## 2022-11-28 DIAGNOSIS — R000 Tachycardia, unspecified: Secondary | ICD-10-CM | POA: Insufficient documentation

## 2022-11-28 DIAGNOSIS — R1010 Upper abdominal pain, unspecified: Secondary | ICD-10-CM

## 2022-11-28 DIAGNOSIS — R112 Nausea with vomiting, unspecified: Secondary | ICD-10-CM | POA: Insufficient documentation

## 2022-11-28 DIAGNOSIS — Z7984 Long term (current) use of oral hypoglycemic drugs: Secondary | ICD-10-CM | POA: Insufficient documentation

## 2022-11-28 DIAGNOSIS — R079 Chest pain, unspecified: Secondary | ICD-10-CM | POA: Diagnosis not present

## 2022-11-28 LAB — URINALYSIS, ROUTINE W REFLEX MICROSCOPIC
Glucose, UA: >=500 mg/dL — AB
Hgb urine dipstick: NEGATIVE
Ketones, ur: 80 mg/dL — AB
Nitrite: NEGATIVE
Protein, ur: 30 mg/dL — AB
Specific Gravity, Urine: 1.02 (ref 1.005–1.030)
pH: 5.5 (ref 5.0–8.0)

## 2022-11-28 LAB — CBC
HCT: 43.5 % (ref 36.0–46.0)
Hemoglobin: 14.5 g/dL (ref 12.0–15.0)
MCH: 27.9 pg (ref 26.0–34.0)
MCHC: 33.3 g/dL (ref 30.0–36.0)
MCV: 83.7 fL (ref 80.0–100.0)
Platelets: 209 10*3/uL (ref 150–400)
RBC: 5.2 MIL/uL — ABNORMAL HIGH (ref 3.87–5.11)
RDW: 12.8 % (ref 11.5–15.5)
WBC: 8.9 10*3/uL (ref 4.0–10.5)
nRBC: 0 % (ref 0.0–0.2)

## 2022-11-28 LAB — URINALYSIS, MICROSCOPIC (REFLEX)

## 2022-11-28 LAB — COMPREHENSIVE METABOLIC PANEL
ALT: 16 U/L (ref 0–44)
AST: 15 U/L (ref 15–41)
Albumin: 3.7 g/dL (ref 3.5–5.0)
Alkaline Phosphatase: 57 U/L (ref 38–126)
Anion gap: 9 (ref 5–15)
BUN: 18 mg/dL (ref 8–23)
CO2: 27 mmol/L (ref 22–32)
Calcium: 9 mg/dL (ref 8.9–10.3)
Chloride: 97 mmol/L — ABNORMAL LOW (ref 98–111)
Creatinine, Ser: 0.94 mg/dL (ref 0.44–1.00)
GFR, Estimated: >60 mL/min (ref >60)
Glucose, Bld: 258 mg/dL — ABNORMAL HIGH (ref 70–99)
Potassium: 3.5 mmol/L (ref 3.5–5.1)
Sodium: 133 mmol/L — ABNORMAL LOW (ref 135–145)
Total Bilirubin: 1 mg/dL (ref 0.3–1.2)
Total Protein: 7 g/dL (ref 6.5–8.1)

## 2022-11-28 LAB — TROPONIN I (HIGH SENSITIVITY)
Troponin I (High Sensitivity): 7 ng/L (ref <18)
Troponin I (High Sensitivity): 7 ng/L (ref <18)

## 2022-11-28 LAB — LIPASE, BLOOD: Lipase: 30 U/L (ref 11–51)

## 2022-11-28 MED ORDER — PANTOPRAZOLE SODIUM 40 MG IV SOLR
40.0000 mg | Freq: Once | INTRAVENOUS | Status: AC
  Administered 2022-11-28: 40 mg via INTRAVENOUS
  Filled 2022-11-28: qty 10

## 2022-11-28 MED ORDER — SUCRALFATE 1 GM/10ML PO SUSP
1.0000 g | Freq: Once | ORAL | Status: AC
  Administered 2022-11-28: 1 g via ORAL

## 2022-11-28 MED ORDER — SUCRALFATE 1 G PO TABS: 1.0000 g | ORAL_TABLET | Freq: Three times a day (TID) | ORAL | 0 refills | Status: AC

## 2022-11-28 MED ORDER — LACTATED RINGERS IV BOLUS
1000.0000 mL | Freq: Once | INTRAVENOUS | Status: AC
  Administered 2022-11-28: 1000 mL via INTRAVENOUS

## 2022-11-28 MED ORDER — SUCRALFATE 1 G PO TABS
1.0000 g | ORAL_TABLET | Freq: Once | ORAL | Status: DC
  Filled 2022-11-28: qty 1

## 2022-11-28 NOTE — ED Provider Notes (Signed)
[facility] Provider Note   CSN: [id number] Arrival date & time: 11/28/22  1703     History  Chief Complaint  Patient presents with   Abdominal Pain   Emesis    Rhonda Ballard is a 72 y.o. female.  She has a history of diabetes.  She has had upper abdominal pain nausea and vomiting that been going on for about 10 days.  She saw her primary care doctor and was seen in the ED 3 days ago.  CT showed distal esophageal thickening consistent with esophagitis gastritis.  She was taken off her Ozempic and started on a PPI and Reglan.  She continues to have vomiting once or twice a day, does not feel any better.  Feels very weak due to not being able to eat or drink much.  Saw her PCP yesterday and they recommended she come back to the emergency department for further eval.  She has not reached out to her GI doctor.  The history is provided by the patient and a relative.  Abdominal Pain Pain location:  Epigastric Pain quality: burning   Pain radiates to:  Chest Pain severity:  Severe Onset quality:  Gradual Duration:  10 days Timing:  Constant Progression:  Unchanged Chronicity:  New Relieved by:  Nothing Worsened by:  Eating Associated symptoms: chest pain, nausea and vomiting   Associated symptoms: no constipation, no cough, no diarrhea, no dysuria and no fever   Emesis Associated symptoms: abdominal pain   Associated symptoms: no cough, no diarrhea and no fever        Home Medications Prior to Admission medications   Medication Sig Start Date End Date Taking? Authorizing Provider  Accu-Chek Softclix Lancets lancets Use as instructed 03/06/21   Renne Crigler, PA-C  Dapagliflozin-metFORMIN HCl ER (XIGDUO XR) 12-998 MG TB24 Take 1 tablet by mouth daily. 03/06/21   Renne Crigler, PA-C  esomeprazole (NEXIUM) 40 MG capsule Take 1 capsule (40 mg total) by mouth daily. 11/25/22   Charlynne Pander, MD  glucose blood (ACCU-CHEK GUIDE) test  strip Use as instructed 03/06/21   Renne Crigler, PA-C  insulin glargine (LANTUS SOLOSTAR) 100 UNIT/ML Solostar Pen Inject 10 Units into the skin daily. 03/06/21   Renne Crigler, PA-C  metoCLOPramide (REGLAN) 10 MG tablet Take 1 tablet (10 mg total) by mouth every 6 (six) hours as needed for nausea (nausea/headache). 11/25/22   Charlynne Pander, MD  ondansetron (ZOFRAN ODT) 4 MG disintegrating tablet Take 1 tablet (4 mg total) by mouth every 8 (eight) hours as needed. 03/06/21   Renne Crigler, PA-C  rosuvastatin (CRESTOR) 5 MG tablet Take 1 tablet (5 mg total) by mouth daily. 03/06/21   Renne Crigler, PA-C      Allergies    Patient has no known allergies.    Review of Systems   Review of Systems  Constitutional:  Negative for fever.  Respiratory:  Negative for cough.   Cardiovascular:  Positive for chest pain.  Gastrointestinal:  Positive for abdominal pain, nausea and vomiting. Negative for constipation and diarrhea.  Genitourinary:  Negative for dysuria.    Physical Exam Updated Vital Signs BP 134/66 (BP Location: Left Arm)   Pulse (!) 108   Temp 98.9 F (37.2 C) (Oral)   Resp 18   Wt 59.9 kg   SpO2 97%   BMI 25.78 kg/m  Physical Exam Vitals and nursing note reviewed.  Constitutional:      General: She is not in  acute distress.    Appearance: Normal appearance. She is well-developed.  HENT:     Head: Normocephalic and atraumatic.  Eyes:     Conjunctiva/sclera: Conjunctivae normal.  Cardiovascular:     Rate and Rhythm: Regular rhythm. Tachycardia present.     Heart sounds: No murmur heard. Pulmonary:     Effort: Pulmonary effort is normal. No respiratory distress.     Breath sounds: Normal breath sounds.  Abdominal:     Palpations: Abdomen is soft.     Tenderness: There is abdominal tenderness in the epigastric area. There is no guarding or rebound.  Musculoskeletal:        General: No deformity. Normal range of motion.     Cervical back: Neck supple.  Skin:     General: Skin is warm and dry.     Capillary Refill: Capillary refill takes less than 2 seconds.  Neurological:     General: No focal deficit present.     Mental Status: She is alert.     ED Results / Procedures / Treatments   Labs (all labs ordered are listed, but only abnormal results are displayed) Labs Reviewed  COMPREHENSIVE METABOLIC PANEL - Abnormal; Notable for the following components:      Result Value   Sodium 133 (*)    Chloride 97 (*)    Glucose, Bld 258 (*)    All other components within normal limits  CBC - Abnormal; Notable for the following components:   RBC 5.20 (*)    All other components within normal limits  URINALYSIS, ROUTINE W REFLEX MICROSCOPIC - Abnormal; Notable for the following components:   Glucose, UA >=500 (*)    Bilirubin Urine MODERATE (*)    Ketones, ur 80 (*)    Protein, ur 30 (*)    Leukocytes,Ua SMALL (*)    All other components within normal limits  URINALYSIS, MICROSCOPIC (REFLEX) - Abnormal; Notable for the following components:   Bacteria, UA RARE (*)    All other components within normal limits  LIPASE, BLOOD  TROPONIN I (HIGH SENSITIVITY)  TROPONIN I (HIGH SENSITIVITY)    EKG EKG Interpretation  Date/Time:  Thursday November 28 2022 20:25:14 EDT Ventricular Rate:  106 PR Interval:  150 QRS Duration: 81 QT Interval:  356 QTC Calculation: 473 R Axis:   -13 Text Interpretation: Sinus tachycardia Biatrial enlargement COPY Confirmed by Meridee Score [phone]) on 11/28/2022 8:29:53 PM  Radiology US Abdomen Limited RUQ (LIVER/GB)  Result Date: 11/28/2022 CLINICAL DATA:  Epigastric pain EXAM: ULTRASOUND ABDOMEN LIMITED RIGHT UPPER QUADRANT COMPARISON:  None Available. FINDINGS: Gallbladder: Small amount of sludge. No shadowing stones. Normal wall thickness. Negative sonographic Murphy Common bile duct: Diameter: 5 mm Liver: No focal lesion identified. Within normal limits in parenchymal echogenicity. Portal vein is patent on color  Doppler imaging with normal direction of blood flow towards the liver. Other: None. IMPRESSION: Small amount of gallbladder sludge. Otherwise negative examination. Electronically Signed   By: Jasmine Pang M.D.   On: 11/28/2022 20:24    Procedures Procedures    Medications Ordered in ED Medications  lactated ringers bolus 1,000 mL (0 mLs Intravenous Stopped 11/28/22 2323)  pantoprazole (PROTONIX) injection 40 mg (40 mg Intravenous Given 11/28/22 2124)  sucralfate (CARAFATE) 1 GM/10ML suspension 1 g (1 g Oral Given 11/28/22 2256)    ED Course/ Medical Decision Making/ A&P Clinical Course as of 11/29/22 1019  Thu Nov 28, 2022  2230 Patient states she is feeling little bit better with the fluids.  She is comfortable plan for outpatient follow-up.  Will contact her GI.  Will maximize her acid medication with adding Carafate and have her use some Maalox [MB]    Clinical Course User Index [MB] Terrilee FilesButler, Shannell Mikkelsen C, MD                             Medical Decision Making Amount and/or Complexity of Data Reviewed Labs: ordered. Radiology: ordered.  Risk Prescription drug management.   This patient complains of upper abdominal pain nausea vomiting; this involves an extensive number of treatment Options and is a complaint that carries with it a high risk of complications and morbidity. The differential includes gastritis, peptic ulcer disease, biliary colic, ACS, metabolic derangement  I ordered, reviewed and interpreted labs, which included CBC normal, chemistries with mildly low sodium and chloride, elevated glucose, urinalysis with ketones no obvious signs of infection I ordered medication IV fluids and PPI, oral Carafate and reviewed PMP when indicated. I ordered imaging studies which included right upper quadrant ultrasound and I independently    visualized and interpreted imaging which showed sludge no evidence of acute cholecystitis Additional history obtained from patient's family  members Previous records obtained and reviewed in epic including recent ED visit and imaging Cardiac monitoring reviewed, normal sinus rhythm Social determinants considered, no significant barriers Critical Interventions: None  After the interventions stated above, I reevaluated the patient and found patient's pain to be resolved and her symptoms improved Admission and further testing considered, no indications for admission or further workup at this time.  Will add Carafate to her regiment and recommended GI follow-up.  Return instructions discussed         Final Clinical Impression(s) / ED Diagnoses Final diagnoses:  Upper abdominal pain  Nausea and vomiting, unspecified vomiting type    Rx / DC Orders ED Discharge Orders          Ordered    sucralfate (CARAFATE) 1 g tablet  3 times daily with meals & bedtime        11/28/22 2232              Terrilee FilesButler, Tarnisha Kachmar C, MD 11/29/22 1020

## 2022-11-28 NOTE — Discharge Instructions (Addendum)
You were seen in the emergency department for continued upper abdominal pain nausea vomiting.  Your lab work showed you to be dehydrated.  You had an ultrasound of your gallbladder that showed some sludge but no gallstones or signs of gallbladder attack otherwise.  Please continue your acid medication and we are adding Carafate to help coat your esophagus.  You can dissolve this in some water and drink.  You can also use Maalox between meals and at bedtime.  Please reach out to your GI doctor for close follow-up as she may require an endoscopy.  Return to the emergency department if any worsening or concerning symptoms.

## 2022-11-28 NOTE — ED Triage Notes (Signed)
Reports was seen last Monday for similar issue and the medicine prescribed then was not helping, she is still having difficulty with keeping food down.
